# Patient Record
Sex: Male | Born: 1985 | Race: White | Hispanic: No | Marital: Single | Smoking: Current every day smoker
Health system: Southern US, Community
[De-identification: ages and names within clinical notes are randomized; demographics above are authoritative.]

## PROBLEM LIST (undated history)

## (undated) HISTORY — PX: SHOULDER SURGERY: SHX246

---
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
## 2010-06-09 ENCOUNTER — Emergency Department (HOSPITAL_COMMUNITY)
Admission: EM | Admit: 2010-06-09 | Discharge: 2010-06-09 | Disposition: A | Discharge status: Home / Self Care | Payer: Self-pay | Attending: Emergency Medicine | Admitting: Emergency Medicine

## 2010-06-09 DIAGNOSIS — M25519 Pain in unspecified shoulder: Secondary | ICD-10-CM | POA: Insufficient documentation

## 2010-06-14 ENCOUNTER — Other Ambulatory Visit (HOSPITAL_COMMUNITY): Payer: Self-pay | Admitting: Specialist

## 2010-06-14 DIAGNOSIS — M25511 Pain in right shoulder: Secondary | ICD-10-CM

## 2010-06-21 ENCOUNTER — Ambulatory Visit (HOSPITAL_COMMUNITY)
Admission: RE | Admit: 2010-06-21 | Discharge: 2010-06-21 | Disposition: A | Discharge status: Home / Self Care | Payer: Self-pay | Source: Ambulatory Visit | Attending: Specialist | Admitting: Specialist

## 2010-06-21 DIAGNOSIS — M25519 Pain in unspecified shoulder: Secondary | ICD-10-CM | POA: Insufficient documentation

## 2010-06-21 DIAGNOSIS — M249 Joint derangement, unspecified: Secondary | ICD-10-CM | POA: Insufficient documentation

## 2010-06-21 DIAGNOSIS — M25511 Pain in right shoulder: Secondary | ICD-10-CM

## 2010-06-21 MED ORDER — IOHEXOL 300 MG/ML  SOLN
10.0000 mL | Freq: Once | INTRAMUSCULAR | Status: AC | PRN
  Administered 2010-06-21: 10 mL via INTRAVENOUS

## 2010-06-21 MED ORDER — GADOBENATE DIMEGLUMINE 529 MG/ML IV SOLN: 0.0500 mL | Freq: Once | INTRAVENOUS | Status: DC | PRN

## 2010-07-07 ENCOUNTER — Encounter (HOSPITAL_COMMUNITY)
Admission: RE | Admit: 2010-07-07 | Discharge: 2010-07-07 | Disposition: A | Discharge status: Home / Self Care | Payer: Self-pay | Source: Ambulatory Visit | Attending: Orthopaedic Surgery | Admitting: Orthopaedic Surgery

## 2010-07-07 DIAGNOSIS — Z01812 Encounter for preprocedural laboratory examination: Secondary | ICD-10-CM | POA: Insufficient documentation

## 2010-07-07 DIAGNOSIS — Z01818 Encounter for other preprocedural examination: Secondary | ICD-10-CM | POA: Insufficient documentation

## 2010-07-07 LAB — SURGICAL PCR SCREEN: MRSA, PCR: NEGATIVE

## 2010-07-07 LAB — CBC
HCT: 46.3 % (ref 39.0–52.0)
MCHC: 36.5 g/dL — ABNORMAL HIGH (ref 30.0–36.0)
MCV: 90.8 fL (ref 78.0–100.0)
RDW: 12.2 % (ref 11.5–15.5)

## 2010-07-12 ENCOUNTER — Ambulatory Visit (HOSPITAL_COMMUNITY)
Admission: RE | Admit: 2010-07-12 | Discharge: 2010-07-12 | Disposition: A | Discharge status: Home / Self Care | Payer: Self-pay | Source: Ambulatory Visit | Attending: Orthopaedic Surgery | Admitting: Orthopaedic Surgery

## 2010-07-12 DIAGNOSIS — M249 Joint derangement, unspecified: Secondary | ICD-10-CM | POA: Insufficient documentation

## 2010-07-12 DIAGNOSIS — F411 Generalized anxiety disorder: Secondary | ICD-10-CM | POA: Insufficient documentation

## 2010-07-12 DIAGNOSIS — F17200 Nicotine dependence, unspecified, uncomplicated: Secondary | ICD-10-CM | POA: Insufficient documentation

## 2010-07-12 DIAGNOSIS — Z01812 Encounter for preprocedural laboratory examination: Secondary | ICD-10-CM | POA: Insufficient documentation

## 2010-07-12 DIAGNOSIS — G8929 Other chronic pain: Secondary | ICD-10-CM | POA: Insufficient documentation

## 2010-07-16 NOTE — Op Note (Signed)
NAME:  Henry Calhoun, Henry Calhoun NO.:  [id number]  MEDICAL RECORD NO.:  [id number]           PATIENT TYPE:  O  LOCATION:  [facility]                         FACILITY:  [facility]  PHYSICIAN:  Vanita Panda. Magnus Ivan, M.D.DATE OF BIRTH:  1985/03/23  DATE OF PROCEDURE:  07/12/2010 DATE OF DISCHARGE:  07/12/2010                              OPERATIVE REPORT   PREOPERATIVE DIAGNOSIS:  Extensive labral tear, right shoulder.  POSTOPERATIVE DIAGNOSIS:  Extensive labral tear, right shoulder.  PROCEDURE:  Right shoulder arthroscopy with extensive debridement of labral tear.  FINDINGS:  Degenerative labral tear mainly anterior labrum with no subluxation and inferior glenohumeral arthritis.  SURGEON:  Vanita Panda. Magnus Ivan, MD  ANESTHESIA: 1. Regional right shoulder block. 2. General.  BLOOD LOSS:  Minimal.  COMPLICATIONS:  None.  INDICATIONS:  Mr. Sproule is a 25 year old gentleman who presented to the ER with debilitating right shoulder pain; however, he states this has been going on for many years.  The ER gave him Percocet and he follows in our office and so we are here on call.  He had continued symptoms of the subluxating shoulder on my physical exam, I could not get shoulder subluxate, but he has significant pain, so we sent him for an MR arthrogram that showed a tear from the 3 o'clock to the 8 o'clock position of the labrum.  Due to the significant findings and his inability to get good pain control, he wished to undergo an arthroscopic intervention.  I explained the risks and benefits of this to him in detail and he wished to proceed with surgery.  PROCEDURE DESCRIPTION:  After informed consent was obtained, the appropriate right shoulder was marked, anesthesia obtained with regional shoulder block, he was then brought to the operating room and placed supine on the operating room table.  General anesthesia was obtained. He was then turned into a lateral position with the  right operative shoulder up padding and an axillary roll under the nonoperative shoulder and beanbag was placed as well.  His arm was then placed in an in-line skeletal retraction using the Arthrex shoulder positioning device.  His right shoulder was then prepped and draped with DuraPrep and sterile drapes.  A time-out was called to identify the correct patient and correct right shoulder.  I then made a posterior arthroscopy portal and entered the glenohumeral joint.  I went directly to the anterior aspect of the shoulder and in the rotator interval made a second portal, so I could place a probe.  Right away you could see there were degenerative changes in the labrum with tearing of the labrum, but the humeral head was actually in a normal appearing position.  There were arthritic changes at the inferior aspect and in the rim of the glenoid, but this appeared just to be a shredded type of labrum with no other instability symptoms.  Due to the anterior port, I placed arthroscopic shavers as well as up cutting biters and an arthroscopic soft tissue ablation and performed an extensive debridement.  The superior aspect of the labrum showed no tear in the biceps tendon and biceps angle was completely  intact and there was no tear at the posterior aspect of the labrum.  The undersurface of the rotator cuff was also intact.  I then allowed fluid to lavage the shoulder and removed all instrumentation.  I closed  each portal sites with interrupted nylon suture.  Xeroform followed by well- padded sterile dressing was applied, and the patient's shoulder was placed in a sling.  He was awakened, extubated, and taken to recovery room in stable condition.  Postoperatively, slowly increase his activities as comfort allows and having get the shoulder moving.  A followup appointment will be established in my office in a week.     Vanita Panda. Magnus Ivan, M.D.     CYB/MEDQ  D:  07/12/2010  T:   07/13/2010  Job:  [id number]  Electronically Signed by Doneen Poisson M.D. on 07/16/2010 11:15:25 AM

## 2010-07-16 NOTE — H&P (Signed)
  NAME:  Henry Calhoun, Henry Calhoun NO.:  [id number]  MEDICAL RECORD NO.:  [id number]           PATIENT TYPE:  O  LOCATION:  [facility]                         FACILITY:  [facility]  PHYSICIAN:  Vanita Panda. Magnus Ivan, M.D.DATE OF BIRTH:  1985/05/27  DATE OF ADMISSION:  07/12/2010 DATE OF DISCHARGE:  07/12/2010                             HISTORY & PHYSICAL   CHIEF COMPLAINT:  Right shoulder pain.  HISTORY OF PRESENT ILLNESS:  Henry Calhoun is someone who was referred to me for chronic shoulder problems.  He is 25 year old, he has had problems right down the shoulder, he says since childhood and it really flared up over the last 3 weeks after especially reaching over the head.  He was seen in the emergency room and course stay placed him on Percocet, Robaxin, ibuprofen, and he was given this in his followup visit as well. He is already asking for more narcotics.  An MRI and arthrogram was obtained in the shoulder and did show labral tear from the 3 o'clock position back to the 8 o'clock position.  On my exam, he did have instability symptoms as well, so I recommended a labral repair, I have counseled him extensively about use of narcotics as well.  PAST MEDICAL HISTORY:  No active medical problems.  DRUG ALLERGIES:  None.  PREVIOUS SURGERIES AND HOSPITALIZATIONS:  None.  FAMILY MEDICAL HISTORY:  Diabetes, blood pressure.  SOCIAL HISTORY:  He is single.  He does smoke half a pack a day, worsening construction.  He says, he does not drink alcohol.  REVIEW OF SYSTEMS:  Negative for chest pain, shortness of breath, fever, chills, nausea, vomiting.  PHYSICAL EXAMINATION:  GENERAL:  He is alert and oriented x3, in no acute distress, but obvious discomfort. HEENT:  Normocephalic and atraumatic.  Pupils are equal, round, and reactive to light.  Extraocular muscles intact. NECK:  Supple. LUNGS:  Clear to auscultation bilaterally. HEART:  Regular rate and rhythm. ABDOMEN:   Benign. EXTREMITIES:  Right shoulder shows extensive pain and clicking with range of motion with some slight instability symptoms, but a negative apprehension sign.  The MRIs reviewed and did show extensive labral tears extending from 3 o'clock to 8 o'clock involving posteroinferior and anterior portions of labrum.  Labrum appears to be shredded.  There is also tear of the inferior glenohumeral ligament.  There is degenerative changes in the humeral head and inferior aspect of the glenoid as well.  ASSESSMENT:  A right shoulder extensive labral tear.  PLAN:  We will proceed to the operating room today for arthroscopic intervention and potentially labral repair.  Depending on the findings of the LR, I have again counseled him about use of narcotics.  They will perform a block as well and thus he can go home as an outpatient following surgery.     Vanita Panda. Magnus Ivan, M.D.     CYB/MEDQ  D:  07/12/2010  T:  07/13/2010  Job:  [id number]  Electronically Signed by Doneen Poisson M.D. on 07/16/2010 11:14:38 AM

## 2011-05-05 ENCOUNTER — Emergency Department (HOSPITAL_COMMUNITY)
Admission: EM | Admit: 2011-05-05 | Discharge: 2011-05-05 | Disposition: A | Discharge status: Home / Self Care | Payer: Self-pay | Attending: Emergency Medicine | Admitting: Emergency Medicine

## 2011-05-05 ENCOUNTER — Emergency Department (HOSPITAL_COMMUNITY): Payer: Self-pay

## 2011-05-05 ENCOUNTER — Encounter (HOSPITAL_COMMUNITY): Payer: Self-pay | Admitting: Emergency Medicine

## 2011-05-05 DIAGNOSIS — J111 Influenza due to unidentified influenza virus with other respiratory manifestations: Secondary | ICD-10-CM | POA: Insufficient documentation

## 2011-05-05 DIAGNOSIS — R079 Chest pain, unspecified: Secondary | ICD-10-CM | POA: Insufficient documentation

## 2011-05-05 NOTE — Discharge Instructions (Signed)
Your strep test and xray were normal. This is likely an influenza like illness. This is caused by a virus and cannot be treated with antibiotics. Try salt water gargles and/or hot tea with honey for your sore throat. You may try saline nose spray for your congestion. Alternate Tylenol and Motrin every 4 hours as needed for fever. Return to the ED with high fever not controlled with antibiotics, worsening shortness of breath, worsening condition, or any other worrisome symptoms.  RESOURCE GUIDE  Dental Problems  Patients with Medicaid: [facility] [phone] [address]                                           [phone] Phone:  [phone]                                                  Phone:  [phone]  If unable to pay or uninsured, contact:  [facility] or [facility]. to become qualified for the adult dental clinic.  Chronic Pain Problems Contact [facility]  [phone] Patients need to be referred by their primary care doctor.  Insufficient Money for Medicine Contact [employer]:  call "211" or [facility] 445-826-3507.  No Primary Care Doctor Call [facility]  2524207292 Other agencies that provide inexpensive medical care    [facility]  [phone]    [facility]  [phone]    [facility]  [phone]    [facility]  [phone]    [facility]  [phone]    [facility]  [phone]  Psychological Services [facility]  [phone] [facility] Services  [phone] [facility]   [phone] (emergency services 4028521113)  Substance Abuse Resources [facility]  717-724-2111 [facility] [phone] [facility] [phone] Floydene Flock [phone] Residential & Outpatient Substance Abuse Program  7148330847  Abuse/Neglect [facility] Child Abuse Hotline 314-258-9930 [facility] Child Abuse Hotline [phone] (After Hours)  Emergency Shelter [facility] [phone]  Maternity Homes [facility] [phone] [facility] [phone]  MRSA Hotline #:   (973) 648-4513    [facility] Resources  [facility] [place]     [employer]                          [facility] [address] [place]                       [address]      [address]  Patrecia Pace  [facility] Phone:  [phone]                                   Phone:  [phone]                 Phone:  [place] Phone:  [place] [phone] (After Hours)

## 2011-05-05 NOTE — ED Provider Notes (Signed)
History     CSN: [id number]  Arrival date & time 05/05/11  0818   First MD Initiated Contact with Patient 05/05/11 0825      Chief Complaint  Patient presents with  . Influenza    C/O cough cold and fever, difficulty eating    (Consider location/radiation/quality/duration/timing/severity/associated sxs/prior treatment) The history is provided by the patient.   Patient presents with intermittent fevers, sore throat, congestion, cough for approximately the last 5-7 days. He states that his highest temperature at home has been 101. He has had associated chills and myalgias. He has been taking over-the-counter DayQuil for his symptoms. Denies difficulty swallowing, shortness of breath. He has had no chest pain. He states that the flu shot this year, and has had no sick contacts. Denies headache, ear pain, myalgias. Denies abdominal pain, nausea, vomiting, diarrhea.  History reviewed. No pertinent past medical history.  No past surgical history on file.  No family history on file.  History  Substance Use Topics  . Smoking status: Not on file  . Smokeless tobacco: Not on file  . Alcohol Use: No      Review of Systems ROS negative except as indicated in HPI  Allergies  Review of patient's allergies indicates no known allergies.  Home Medications   Current Outpatient Rx  Name Route Sig Dispense Refill  . DAYQUIL PO Oral Take 1 tablet by mouth every 12 (twelve) hours.      BP 134/83  Pulse 75  Temp(Src) 98.6 F (37 C) (Oral)  Resp 18  Ht 5\' 9"  (1.753 m)  Wt 210 lb (95.255 kg)  BMI 31.01 kg/m2  Physical Exam  Nursing note and vitals reviewed. Constitutional: He is oriented to person, place, and time. He appears well-developed and well-nourished. No distress.  HENT:  Head: Normocephalic and atraumatic.  Right Ear: External ear normal.  Left Ear: External ear normal.  Mouth/Throat: Oropharynx is clear and moist. No oropharyngeal exudate.  Eyes: Conjunctivae and EOM  are normal. Pupils are equal, round, and reactive to light. Right eye exhibits no discharge. Left eye exhibits no discharge.  Neck: Normal range of motion. Neck supple.  Cardiovascular: Normal rate, regular rhythm and normal heart sounds.  Exam reveals no gallop and no friction rub.   No murmur heard. Pulmonary/Chest: Effort normal and breath sounds normal. He has no wheezes. He has no rales. He exhibits no tenderness.       Bilateral breath sounds normal with good air movement. Chest is nontender to palpation.  Abdominal: Soft. Bowel sounds are normal. There is no tenderness. There is no rebound and no guarding.  Musculoskeletal: Normal range of motion.  Lymphadenopathy:    He has no cervical adenopathy.  Neurological: He is alert and oriented to person, place, and time.  Skin: Skin is warm and dry. No rash noted. He is not diaphoretic.  Psychiatric: He has a normal mood and affect.    ED Course  Procedures (including critical care time)   Labs Reviewed  RAPID STREP SCREEN   Dg Chest 2 View  05/05/2011  *RADIOLOGY REPORT*  Clinical Data: Fever and cough.  Mid chest pain.  CHEST - 2 VIEW  Comparison: Chest x-ray 06/21/2010  Findings: Lung volumes are normal.  No consolidative airspace disease.  No pleural effusions.  No pneumothorax.  No pulmonary nodule or mass noted.  Pulmonary vasculature and the cardiomediastinal silhouette are within normal limits.  IMPRESSION: 1. No radiographic evidence of acute cardiopulmonary disease.  Original Report Authenticated By:  Florencia Reasons, M.D.   I personally reviewed the plain films.  1. Influenza-like illness       MDM  Pt with fevers for past several days. He is afebrile here. CXR unremarkable. Suspect likely influenza like illness. Will tx symptomatically. Return precautions discussed.       Treon Kehl, [place] 05/05/11 [phone]

## 2011-05-05 NOTE — ED Notes (Signed)
Patient had his chest X Ray and rapid strep was done and sent to the lab

## 2011-05-15 NOTE — ED Provider Notes (Signed)
Medical screening examination/treatment/procedure(s) were performed by non-physician practitioner and as supervising physician I was immediately available for consultation/collaboration.  Howell Groesbeck P Tzirel Leonor, MD 05/15/11 1502 

## 2012-06-21 ENCOUNTER — Emergency Department (HOSPITAL_COMMUNITY)
Admission: EM | Admit: 2012-06-21 | Discharge: 2012-06-21 | Disposition: A | Discharge status: Home / Self Care | Payer: Self-pay | Attending: Emergency Medicine | Admitting: Emergency Medicine

## 2012-06-21 ENCOUNTER — Encounter (HOSPITAL_COMMUNITY): Payer: Self-pay | Admitting: Emergency Medicine

## 2012-06-21 DIAGNOSIS — K029 Dental caries, unspecified: Secondary | ICD-10-CM | POA: Insufficient documentation

## 2012-06-21 DIAGNOSIS — S025XXA Fracture of tooth (traumatic), initial encounter for closed fracture: Secondary | ICD-10-CM | POA: Insufficient documentation

## 2012-06-21 DIAGNOSIS — F17200 Nicotine dependence, unspecified, uncomplicated: Secondary | ICD-10-CM | POA: Insufficient documentation

## 2012-06-21 DIAGNOSIS — Y929 Unspecified place or not applicable: Secondary | ICD-10-CM | POA: Insufficient documentation

## 2012-06-21 DIAGNOSIS — X58XXXA Exposure to other specified factors, initial encounter: Secondary | ICD-10-CM | POA: Insufficient documentation

## 2012-06-21 DIAGNOSIS — Y9389 Activity, other specified: Secondary | ICD-10-CM | POA: Insufficient documentation

## 2012-06-21 MED ORDER — OXYCODONE-ACETAMINOPHEN 5-325 MG PO TABS: 1.0000 | ORAL_TABLET | Freq: Four times a day (QID) | ORAL | Status: DC | PRN

## 2012-06-21 MED ORDER — AMOXICILLIN 500 MG PO CAPS: 500.0000 mg | ORAL_CAPSULE | Freq: Three times a day (TID) | ORAL | Status: DC

## 2012-06-21 NOTE — ED Provider Notes (Signed)
History    This chart was scribed for non-physician practitioner working with Hanley Seamen, MD by Frederik Pear, ED Scribe. This patient was seen in room WTR1/WLPT1 and the patient's care was started at 2257.   CSN: [id number]  Arrival date & time 06/21/12  2252   First MD Initiated Contact with Patient 06/21/12 2257      Chief Complaint  Patient presents with  . Dental Pain    (Consider location/radiation/quality/duration/timing/severity/associated sxs/prior treatment) The history is provided by the patient and medical records. No language interpreter was used.    Henry Calhoun is a 27 y.o. male who presents to the Emergency Department complaining of sudden onset, constant, 10/10 right upper dental pain that is aggravated by eating and drinking and alleviated by nothing that began around 1200 today when his tooth broke. He denies any associate fevers. He states that he treated the pain with Tylenol and Orajel with no relief.  History reviewed. No pertinent past medical history.  Past Surgical History  Procedure Laterality Date  . Shoulder surgery      History reviewed. No pertinent family history.  History  Substance Use Topics  . Smoking status: Current Every Day Smoker -- 0.50 packs/day  . Smokeless tobacco: Not on file  . Alcohol Use: No      Review of Systems  HENT: Positive for dental problem.   All other systems reviewed and are negative.   Allergies  Review of patient's allergies indicates no known allergies.  Home Medications   Current Outpatient Rx  Name  Route  Sig  Dispense  Refill  . amoxicillin (AMOXIL) 500 MG capsule   Oral   Take 1 capsule (500 mg total) by mouth 3 (three) times daily.   21 capsule   0   . oxyCODONE-acetaminophen (PERCOCET/ROXICET) 5-325 MG per tablet   Oral   Take 1 tablet by mouth every 6 (six) hours as needed for pain.   12 tablet   0   . Pseudoephedrine-APAP-DM (DAYQUIL PO)   Oral   Take 1 tablet by mouth every 12  (twelve) hours.           BP 136/74  Pulse 87  Temp(Src) 98.7 F (37.1 C) (Oral)  Resp 15  Ht 5\' 10"  (1.778 m)  Wt 210 lb (95.255 kg)  BMI 30.13 kg/m2  SpO2 98%  Physical Exam  Nursing note and vitals reviewed. Constitutional: He is oriented to person, place, and time. He appears well-developed and well-nourished. No distress.  HENT:  Head: Normocephalic and atraumatic.  Mouth/Throat: Dental caries present.    Eyes: Conjunctivae and EOM are normal. Pupils are equal, round, and reactive to light.  Neck: Normal range of motion. Neck supple. No tracheal deviation present.  Cardiovascular: Normal rate and regular rhythm.   Pulmonary/Chest: Effort normal and breath sounds normal. No respiratory distress.  Abdominal: Soft. He exhibits no distension.  Musculoskeletal: Normal range of motion. He exhibits no edema.  Neurological: He is alert and oriented to person, place, and time.  Skin: Skin is warm and dry.  Psychiatric: He has a normal mood and affect. His behavior is normal.    ED Course  Procedures (including critical care time)  DIAGNOSTIC STUDIES: Oxygen Saturation is 98% on room air, normal  by my interpretation.    COORDINATION OF CARE:  23:02- Discussed planned course of treatment with the patient, including pain control medication, a course of antitbiotics, and a dental block, who is agreeable at this time.  Labs  Reviewed - No data to display No results found.   1. Broken tooth without complication, closed, initial encounter       MDM  Dental nlock performed. Dental referral given.  Pt has been advised of the symptoms that warrant their return to the ED. Patient has voiced understanding and has agreed to follow-up with the PCP or specialist.  I personally performed the services described in this documentation, which was scribed in my presence. The recorded information has been reviewed and is accurate.      Dorthula Matas, PA-C 06/21/12 2339

## 2012-06-21 NOTE — ED Notes (Signed)
Pt states he broke a tooth today at lunch time  Pt states the tooth is on the top right

## 2012-06-22 NOTE — ED Provider Notes (Signed)
Medical screening examination/treatment/procedure(s) were performed by non-physician practitioner and as supervising physician I was immediately available for consultation/collaboration.   Hanley Seamen, MD 06/22/12 [phone]

## 2012-06-24 ENCOUNTER — Emergency Department (HOSPITAL_COMMUNITY)
Admission: EM | Admit: 2012-06-24 | Discharge: 2012-06-24 | Disposition: A | Discharge status: Home / Self Care | Payer: Self-pay | Attending: Emergency Medicine | Admitting: Emergency Medicine

## 2012-06-24 ENCOUNTER — Encounter (HOSPITAL_COMMUNITY): Payer: Self-pay | Admitting: *Deleted

## 2012-06-24 DIAGNOSIS — R197 Diarrhea, unspecified: Secondary | ICD-10-CM | POA: Insufficient documentation

## 2012-06-24 DIAGNOSIS — R220 Localized swelling, mass and lump, head: Secondary | ICD-10-CM | POA: Insufficient documentation

## 2012-06-24 DIAGNOSIS — K089 Disorder of teeth and supporting structures, unspecified: Secondary | ICD-10-CM | POA: Insufficient documentation

## 2012-06-24 DIAGNOSIS — R509 Fever, unspecified: Secondary | ICD-10-CM | POA: Insufficient documentation

## 2012-06-24 DIAGNOSIS — R112 Nausea with vomiting, unspecified: Secondary | ICD-10-CM | POA: Insufficient documentation

## 2012-06-24 DIAGNOSIS — F17200 Nicotine dependence, unspecified, uncomplicated: Secondary | ICD-10-CM | POA: Insufficient documentation

## 2012-06-24 DIAGNOSIS — K0889 Other specified disorders of teeth and supporting structures: Secondary | ICD-10-CM

## 2012-06-24 MED ORDER — IBUPROFEN 600 MG PO TABS: 600.0000 mg | ORAL_TABLET | Freq: Four times a day (QID) | ORAL | Status: DC | PRN

## 2012-06-24 MED ORDER — OXYCODONE-ACETAMINOPHEN 5-325 MG PO TABS: 1.0000 | ORAL_TABLET | ORAL | Status: DC | PRN

## 2012-06-24 MED ORDER — CLINDAMYCIN HCL 150 MG PO CAPS: 300.0000 mg | ORAL_CAPSULE | Freq: Three times a day (TID) | ORAL | Status: DC

## 2012-06-24 NOTE — ED Provider Notes (Signed)
History    This chart was scribed for non-physician practitioner working with Henry Calhoun. Ghim, MD by ED Scribe, Burman Nieves. This patient was seen in room WTR5/WTR5 and the patient's care was started at 5:24 PM.   CSN: [id number]  Arrival date & time 06/24/12  1444   First MD Initiated Contact with Patient 06/24/12 1724      Chief Complaint  Patient presents with  . Dental Pain  . Diarrhea  . Emesis    (Consider location/radiation/quality/duration/timing/severity/associated sxs/prior treatment) Patient is a 27 y.o. male presenting with tooth pain. The history is provided by the patient. No language interpreter was used.  Dental PainThe primary symptoms include mouth pain. The symptoms are worsening. The symptoms occur constantly.  Mouth pain occurs constantly.   Henry Calhoun is a 27 y.o. male who presents to the Emergency Department complaining of moderate constant right sided dental pain with associated subjective fever onset 3 days ago. Pt states that she has had nausea, vomiting, and diarrhea since 10:30 am this morning. Last episode of emesis was this morning. Pt states he is taking oxycodone for the pain as of now. Pt states that he has been taking antibiotics (Amoxycilin) since last Thursday with no immediate relief. Pt denies chills, cough, SOB, weakness, and any other associated symptoms.   History reviewed. No pertinent past medical history.  Past Surgical History  Procedure Laterality Date  . Shoulder surgery      No family history on file.  History  Substance Use Topics  . Smoking status: Current Every Day Smoker -- 0.50 packs/day  . Smokeless tobacco: Not on file  . Alcohol Use: No      Review of Systems  HENT: Positive for dental problem.   All other systems reviewed and are negative.    Allergies  Review of patient's allergies indicates no known allergies.  Home Medications   Current Outpatient Rx  Name  Route  Sig  Dispense  Refill  . acetaminophen  (TYLENOL) 500 MG tablet   Oral   Take 1,000 mg by mouth every 6 (six) hours as needed for pain.         Marland Kitchen amoxicillin (AMOXIL) 500 MG capsule   Oral   Take 1 capsule (500 mg total) by mouth 3 (three) times daily.   21 capsule   0   . benzocaine (ORAJEL) 10 % mucosal gel   Mouth/Throat   Use as directed in the mouth or throat as needed for pain.         Marland Kitchen oxyCODONE-acetaminophen (PERCOCET/ROXICET) 5-325 MG per tablet   Oral   Take 1 tablet by mouth every 6 (six) hours as needed for pain.   12 tablet   0     BP 119/76  Pulse 90  Temp(Src) 98.1 F (36.7 C) (Oral)  Resp 18  SpO2 97%  Physical Exam  Nursing note and vitals reviewed. Constitutional: He is oriented to person, place, and time. He appears well-developed and well-nourished. No distress.  HENT:  Head: Normocephalic and atraumatic.  Right upper 3rd molar look partially chipped with cavity accompanied with swelling on surrounding gum.   Eyes: EOM are normal. Pupils are equal, round, and reactive to light.  Neck: Neck supple. No tracheal deviation present.  Cardiovascular: Normal rate, regular rhythm and normal heart sounds.   Pulmonary/Chest: Effort normal and breath sounds normal. No respiratory distress. He has no wheezes.  Musculoskeletal: Normal range of motion.  Neurological: He is alert and oriented to person,  place, and time.  Skin: Skin is warm and dry.  Psychiatric: He has a normal mood and affect. His behavior is normal.    ED Course  Procedures (including critical care time) DIAGNOSTIC STUDIES: Oxygen Saturation is 97% on room air, adequate by my interpretation.    COORDINATION OF CARE: 6:17 PM Discussed ED treatment with pt and pt agrees. ,    Labs Reviewed - No data to display No results found.   1. Pain, dental       MDM  Pt with dental pain with possible early abscess. Will switch to clinamycin, no improvement on amoxil. Will also refer to oral surgery.  Pt otherwise non toxic  afebrile     Filed Vitals:   06/24/12 1702  BP: 119/76  Pulse: 90  Temp: 98.1 F (36.7 C)  TempSrc: Oral  Resp: 18  SpO2: 97%          Lottie Mussel, PA-C 06/25/12 0143  Lottie Mussel, PA-C 06/25/12 0143

## 2012-06-24 NOTE — ED Notes (Signed)
Pt states was here Friday for dental pain on R side, given dental block, still having dental pain, also this morning had nausea, vomiting and diarrhea, states hasn't had vomiting or diarrhea since 10:30 am this morning.

## 2012-06-25 NOTE — ED Provider Notes (Signed)
Medical screening examination/treatment/procedure(s) were performed by non-physician practitioner and as supervising physician I was immediately available for consultation/collaboration.  [name]. Oletta Lamas, MD 06/25/12 9562

## 2012-07-15 ENCOUNTER — Encounter (HOSPITAL_COMMUNITY): Payer: Self-pay | Admitting: Emergency Medicine

## 2012-07-15 ENCOUNTER — Emergency Department (HOSPITAL_COMMUNITY)
Admission: EM | Admit: 2012-07-15 | Discharge: 2012-07-15 | Disposition: A | Discharge status: Home / Self Care | Payer: Self-pay | Attending: Emergency Medicine | Admitting: Emergency Medicine

## 2012-07-15 DIAGNOSIS — F17200 Nicotine dependence, unspecified, uncomplicated: Secondary | ICD-10-CM | POA: Insufficient documentation

## 2012-07-15 DIAGNOSIS — K029 Dental caries, unspecified: Secondary | ICD-10-CM | POA: Insufficient documentation

## 2012-07-15 DIAGNOSIS — K0381 Cracked tooth: Secondary | ICD-10-CM | POA: Insufficient documentation

## 2012-07-15 MED ORDER — IBUPROFEN 800 MG PO TABS: 800.0000 mg | ORAL_TABLET | Freq: Three times a day (TID) | ORAL | Status: DC

## 2012-07-15 MED ORDER — PENICILLIN V POTASSIUM 500 MG PO TABS: 500.0000 mg | ORAL_TABLET | Freq: Four times a day (QID) | ORAL | Status: AC

## 2012-07-15 MED ORDER — HYDROCODONE-ACETAMINOPHEN 5-325 MG PO TABS: 1.0000 | ORAL_TABLET | Freq: Four times a day (QID) | ORAL | Status: DC | PRN

## 2012-07-15 NOTE — ED Notes (Signed)
Pt states that he broke one of his molars this morning and it is split in two. Feels infected.

## 2012-07-15 NOTE — ED Provider Notes (Signed)
History    This chart was scribed for non-physician practitioner Jaynie Crumble, PA-C working with Raeford Razor, MD by Gerlean Ren, ED Scribe. This patient was seen in room WTR6/WTR6 and the patient's care was started at 9:49 PM.    CSN: [id number]  Arrival date & time 07/15/12  2116   First MD Initiated Contact with Patient 07/15/12 2131      Chief Complaint  Patient presents with  . Dental Pain    The history is provided by the patient. No language interpreter was used.  Henry Calhoun is a 27 y.o. male who presents to the Emergency Department complaining of lower right dental pain with sudden onset this morning while eating.  Pt states the tooth cracked and that it feels infected.  Pt has used Tylenol with no improvements to pain.   Pt has dentist in [place] that he has not seen today.    History reviewed. No pertinent past medical history.  Past Surgical History  Procedure Laterality Date  . Shoulder surgery      History reviewed. No pertinent family history.  History  Substance Use Topics  . Smoking status: Current Every Day Smoker -- 0.50 packs/day  . Smokeless tobacco: Not on file  . Alcohol Use: No      Review of Systems  Constitutional: Negative for fever and chills.  HENT: Positive for dental problem.   Respiratory: Negative for shortness of breath.   Gastrointestinal: Negative for nausea and vomiting.  Neurological: Negative for weakness.    Allergies  Review of patient's allergies indicates no known allergies.  Home Medications   Current Outpatient Rx  Name  Route  Sig  Dispense  Refill  . acetaminophen (TYLENOL) 500 MG tablet   Oral   Take 1,000 mg by mouth every 6 (six) hours as needed for pain.           BP 146/87  Pulse 69  Temp(Src) 98.9 F (37.2 C) (Oral)  SpO2 99%  Physical Exam  Nursing note and vitals reviewed. Constitutional: He is oriented to person, place, and time. He appears well-developed and well-nourished. No distress.   HENT:  Head: Normocephalic and atraumatic.  Multiple carries and filling throughout, right lower second molar has filling but no surrounding erythema or swelling.  Eyes: EOM are normal.  Neck: Neck supple. No tracheal deviation present.  Cardiovascular: Normal rate.   Pulmonary/Chest: Effort normal. No respiratory distress.  Musculoskeletal: Normal range of motion.  Neurological: He is alert and oriented to person, place, and time.  Skin: Skin is warm and dry.  Psychiatric: He has a normal mood and affect. His behavior is normal.    ED Course  Procedures (including critical care time) DIAGNOSTIC STUDIES: Oxygen Saturation is 99% on room air, normal by my interpretation.    COORDINATION OF CARE: 9:52 PM- Informed pt that he needs to see his dentist.  Educated pt of local dental care available at reduced cost.  Discussed antibiotics and short term pain medicine.  Pt verbalizes understanding and agrees with plan.     1. Pain due to dental caries       MDM  Pt with dental pain. Multiple carries on exam. No signs of a dental abscess. Will start on penicillin, pain medications, follow up with dentist.   I personally performed the services described in this documentation, which was scribed in my presence. The recorded information has been reviewed and is accurate.        Lottie Mussel, PA-C  07/16/12 0031 

## 2012-07-16 ENCOUNTER — Telehealth (HOSPITAL_COMMUNITY): Payer: Self-pay | Admitting: Emergency Medicine

## 2012-07-16 NOTE — ED Notes (Signed)
Pt called Dr Larina Earthly and was told that they wouldn't see him and they weren't on call. Checked chart and pt was referred to Dr Gerri Lins. Called the dentist and faxed over the referral to [phone] to [place].

## 2012-07-17 NOTE — ED Provider Notes (Signed)
Medical screening examination/treatment/procedure(s) were performed by non-physician practitioner and as supervising physician I was immediately available for consultation/collaboration.  Amir Fick, MD 07/17/12 0056 

## 2013-03-08 ENCOUNTER — Emergency Department (HOSPITAL_COMMUNITY): Payer: Self-pay

## 2013-03-08 ENCOUNTER — Encounter (HOSPITAL_COMMUNITY): Payer: Self-pay | Admitting: Emergency Medicine

## 2013-03-08 ENCOUNTER — Emergency Department (HOSPITAL_COMMUNITY)
Admission: EM | Admit: 2013-03-08 | Discharge: 2013-03-08 | Disposition: A | Discharge status: Home / Self Care | Payer: Self-pay | Attending: Emergency Medicine | Admitting: Emergency Medicine

## 2013-03-08 DIAGNOSIS — F17200 Nicotine dependence, unspecified, uncomplicated: Secondary | ICD-10-CM | POA: Insufficient documentation

## 2013-03-08 DIAGNOSIS — Z9889 Other specified postprocedural states: Secondary | ICD-10-CM | POA: Insufficient documentation

## 2013-03-08 DIAGNOSIS — M25511 Pain in right shoulder: Secondary | ICD-10-CM

## 2013-03-08 DIAGNOSIS — M25519 Pain in unspecified shoulder: Secondary | ICD-10-CM | POA: Insufficient documentation

## 2013-03-08 MED ORDER — HYDROCODONE-ACETAMINOPHEN 5-325 MG PO TABS: 1.0000 | ORAL_TABLET | ORAL | Status: DC | PRN

## 2013-03-08 MED ORDER — IBUPROFEN 800 MG PO TABS: 800.0000 mg | ORAL_TABLET | Freq: Three times a day (TID) | ORAL | Status: DC

## 2013-03-08 NOTE — ED Notes (Signed)
Pt with hx of shoulder surgery.  Pt works in [employer].  Pt denies any overt injury.  Pt states he's had increasing pain x 1 week.

## 2013-03-08 NOTE — ED Notes (Signed)
X-ray dept called to give room number for PT.

## 2013-03-08 NOTE — ED Notes (Addendum)
Pt reports right shoulder pain x 1 week, hx of shoulder surgery 3 years ago, denies new injury. Pt reports its a burning pain that increases with movement.

## 2013-03-08 NOTE — ED Provider Notes (Signed)
CSN: [id number]     Arrival date & time 03/08/13  1603 History   First MD Initiated Contact with Patient 03/08/13 1821 This chart was scribed for non-physician practitioner Allean Found, PA-C working with Hurman Horn, MD by Valera Castle, ED scribe. This patient was seen in room TR05C/TR05C and the patient's care was started at 7:03 PM.     Chief Complaint  Patient presents with  . Shoulder Pain    The history is provided by the patient. No language interpreter was used.   HPI Comments: Henry Calhoun is a 27 y.o. male who presents to the Emergency Department complaining of sudden, burning, intermittent, right shoulder pain, onset a few weeks ago. He reports h/o shoulder surgery 3 years ago for torn labrum. He states that his burning pain is exacerbated by movement, especially turning his neck. He reports not being able to use hammers and other tools normally due to the pain. He reports taking Ibuprofen for his pain, with little relief. He denies fever, cough, and any other associated symptoms. He denies any medical history.   PCP - No PCP Per Patient  History reviewed. No pertinent past medical history. Past Surgical History  Procedure Laterality Date  . Shoulder surgery     History reviewed. No pertinent family history. History  Substance Use Topics  . Smoking status: Current Every Day Smoker -- 0.50 packs/day  . Smokeless tobacco: Not on file  . Alcohol Use: No    Review of Systems  Constitutional: Negative for fever.  Respiratory: Negative for cough.   Musculoskeletal: Positive for arthralgias (right shoulder).  All other systems reviewed and are negative.    Allergies  Review of patient's allergies indicates no known allergies.  Home Medications  No current outpatient prescriptions on file.  BP 121/70  Pulse 87  Temp(Src) 98 F (36.7 C) (Oral)  Resp 18  Wt 198 lb 6 oz (89.982 kg)  SpO2 97%  Physical Exam  Nursing note and vitals reviewed. Constitutional: He  is oriented to person, place, and time. He appears well-developed and well-nourished. No distress.  HENT:  Head: Normocephalic and atraumatic.  Eyes: EOM are normal.  Neck: Neck supple. No tracheal deviation present.  Cardiovascular: Normal rate.   DP intact.   Pulmonary/Chest: Effort normal. No respiratory distress.  Musculoskeletal: Normal range of motion.  No midline or pericervical tenderness. Right shoulder without swelling discoloration. No deformity. Full ROM with pain on extremes. Painful abduction.   Neurological: He is alert and oriented to person, place, and time.  Skin: Skin is warm and dry.  Psychiatric: He has a normal mood and affect. His behavior is normal.    ED Course  Procedures (including critical care time)  DIAGNOSTIC STUDIES: Oxygen Saturation is 97% on room air, normal by my interpretation.    COORDINATION OF CARE: 7:06 PM-Discussed treatment plan which includes imaging results with pt at bedside and pt agreed to plan.   Labs Review Labs Reviewed - No data to display Imaging Review No results found.  EKG Interpretation   None      No orders of the defined types were placed in this encounter.    MDM  No diagnosis found. 1. Right shoulder pain  Uncomplicated right shoulder pain without concerning exam deficits.       I personally performed the services described in this documentation, which was scribed in my presence. The recorded information has been reviewed and is accurate.     Arnoldo Hooker, PA-C 03/08/13  1923 

## 2013-03-10 NOTE — ED Provider Notes (Signed)
Medical screening examination/treatment/procedure(s) were performed by non-physician practitioner and as supervising physician I was immediately available for consultation/collaboration.  Dhalia Zingaro M Jourdyn Hasler, MD 03/10/13 1627 

## 2013-07-03 ENCOUNTER — Encounter (HOSPITAL_COMMUNITY): Payer: Self-pay | Admitting: Emergency Medicine

## 2013-07-03 ENCOUNTER — Emergency Department (HOSPITAL_COMMUNITY)
Admission: EM | Admit: 2013-07-03 | Discharge: 2013-07-03 | Disposition: A | Discharge status: Home / Self Care | Payer: Self-pay | Attending: Emergency Medicine | Admitting: Emergency Medicine

## 2013-07-03 ENCOUNTER — Emergency Department (HOSPITAL_COMMUNITY): Payer: Self-pay

## 2013-07-03 DIAGNOSIS — F458 Other somatoform disorders: Secondary | ICD-10-CM

## 2013-07-03 DIAGNOSIS — R590 Localized enlarged lymph nodes: Secondary | ICD-10-CM

## 2013-07-03 DIAGNOSIS — F17200 Nicotine dependence, unspecified, uncomplicated: Secondary | ICD-10-CM | POA: Insufficient documentation

## 2013-07-03 DIAGNOSIS — F411 Generalized anxiety disorder: Secondary | ICD-10-CM | POA: Insufficient documentation

## 2013-07-03 DIAGNOSIS — F449 Dissociative and conversion disorder, unspecified: Secondary | ICD-10-CM | POA: Insufficient documentation

## 2013-07-03 DIAGNOSIS — R599 Enlarged lymph nodes, unspecified: Secondary | ICD-10-CM | POA: Insufficient documentation

## 2013-07-03 LAB — CBC WITH DIFFERENTIAL/PLATELET
BASOS ABS: 0 10*3/uL (ref 0.0–0.1)
Basophils Relative: 0 % (ref 0–1)
Eosinophils Absolute: 0.2 10*3/uL (ref 0.0–0.7)
Eosinophils Relative: 1 % (ref 0–5)
HCT: 47.8 % (ref 39.0–52.0)
Hemoglobin: 17.9 g/dL — ABNORMAL HIGH (ref 13.0–17.0)
LYMPHS ABS: 2.6 10*3/uL (ref 0.7–4.0)
LYMPHS PCT: 20 % (ref 12–46)
MCH: 32.8 pg (ref 26.0–34.0)
MCHC: 37.4 g/dL — ABNORMAL HIGH (ref 30.0–36.0)
MCV: 87.5 fL (ref 78.0–100.0)
Monocytes Absolute: 1.3 10*3/uL — ABNORMAL HIGH (ref 0.1–1.0)
Monocytes Relative: 10 % (ref 3–12)
NEUTROS ABS: 8.9 10*3/uL — AB (ref 1.7–7.7)
NEUTROS PCT: 68 % (ref 43–77)
PLATELETS: 198 10*3/uL (ref 150–400)
RBC: 5.46 MIL/uL (ref 4.22–5.81)
RDW: 11.9 % (ref 11.5–15.5)
WBC: 13 10*3/uL — AB (ref 4.0–10.5)

## 2013-07-03 LAB — RAPID STREP SCREEN (MED CTR MEBANE ONLY): STREPTOCOCCUS, GROUP A SCREEN (DIRECT): NEGATIVE

## 2013-07-03 LAB — BASIC METABOLIC PANEL
BUN: 13 mg/dL (ref 6–23)
CHLORIDE: 98 meq/L (ref 96–112)
CO2: 21 meq/L (ref 19–32)
CREATININE: 0.98 mg/dL (ref 0.50–1.35)
Calcium: 9.2 mg/dL (ref 8.4–10.5)
GFR calc non Af Amer: >90 mL/min (ref >90)
Glucose, Bld: 109 mg/dL — ABNORMAL HIGH (ref 70–99)
POTASSIUM: 3.7 meq/L (ref 3.7–5.3)
SODIUM: 138 meq/L (ref 137–147)

## 2013-07-03 LAB — MONONUCLEOSIS SCREEN: MONO SCREEN: NEGATIVE

## 2013-07-03 LAB — ETHANOL: ALCOHOL ETHYL (B): 68 mg/dL — AB (ref 0–11)

## 2013-07-03 MED ORDER — CEPASTAT 14.5 MG MT LOZG: 1.0000 | LOZENGE | OROMUCOSAL | Status: DC | PRN

## 2013-07-03 MED ORDER — KETOROLAC TROMETHAMINE 30 MG/ML IJ SOLN
60.0000 mg | Freq: Once | INTRAMUSCULAR | Status: AC
  Administered 2013-07-03: 60 mg via INTRAMUSCULAR
  Filled 2013-07-03: qty 2

## 2013-07-03 MED ORDER — METHYLPREDNISOLONE SODIUM SUCC 125 MG IJ SOLR
125.0000 mg | Freq: Once | INTRAMUSCULAR | Status: AC
Start: 2013-07-03 — End: 2013-07-03
  Administered 2013-07-03: 125 mg via INTRAVENOUS
  Filled 2013-07-03: qty 2

## 2013-07-03 MED ORDER — LORAZEPAM 2 MG/ML IJ SOLN
1.0000 mg | Freq: Once | INTRAMUSCULAR | Status: AC
  Administered 2013-07-03: 1 mg via INTRAVENOUS
  Filled 2013-07-03: qty 1

## 2013-07-03 MED ORDER — IOHEXOL 300 MG/ML  SOLN
100.0000 mL | Freq: Once | INTRAMUSCULAR | Status: AC | PRN
  Administered 2013-07-03: 100 mL via INTRAVENOUS

## 2013-07-03 MED ORDER — NAPROXEN 500 MG PO TABS: 500.0000 mg | ORAL_TABLET | Freq: Two times a day (BID) | ORAL | Status: DC

## 2013-07-03 MED ORDER — METHYLPREDNISOLONE 4 MG PO KIT: PACK | ORAL | Status: DC

## 2013-07-03 NOTE — ED Provider Notes (Signed)
CSN: [id number]     Arrival date & time 07/03/13  0219 History   First MD Initiated Contact with Patient 07/03/13 0226     Chief Complaint  Patient presents with  . Allergic Reaction     (Consider location/radiation/quality/duration/timing/severity/associated sxs/prior Treatment) HPI 28 yo male presents to the ER from home with complaint of the sensation of swelling of his tongue and throat.  Patient reports over the last 2-3 days, he has had swelling of neck glands and a painful knot with some sore throat.  He reports when he takes a hot shower, symptoms improved.  He has had some sinus problems.  Tonight around midnight, patient had some [place] food.  Patient felt like his glands were swelling after eating.  He felt like he had difficulties swallowing and breathing.  Patient reports as long as he was drinking water continuously, he felt better.   No hives, no wheezing, no shortness of breath.  He has eaten [place] food before without difficulty.  Patient reports that he felt like he was panicking because his throat was closing up.  Patient appeared very anxious upon arrival History reviewed. No pertinent past medical history. Past Surgical History  Procedure Laterality Date  . Shoulder surgery     History reviewed. No pertinent family history. History  Substance Use Topics  . Smoking status: Current Every Day Smoker -- 0.50 packs/day  . Smokeless tobacco: Not on file  . Alcohol Use: No    Review of Systems   See History of Present Illness; otherwise all other systems are reviewed and negative  Allergies  Review of patient's allergies indicates no known allergies.  Home Medications   Prior to Admission medications   Not on File   BP 163/80  Pulse 96  Temp(Src) 98.8 F (37.1 C) (Oral)  Resp 19  Ht 5\' 10"  (1.778 m)  Wt 205 lb (92.987 kg)  BMI 29.41 kg/m2  SpO2 98% Physical Exam  Nursing note and vitals reviewed. Constitutional: He is oriented to person, place, and  time. He appears well-developed and well-nourished. He appears distressed.  HENT:  Head: Normocephalic and atraumatic.  Right Ear: External ear normal.  Left Ear: External ear normal.  Nose: Nose normal.  Mouth/Throat: Oropharynx is clear and moist. No oropharyngeal exudate.  Patient has some painful lymphadenopathy to the preauricular nodes bilaterally  Eyes: Conjunctivae and EOM are normal. Pupils are equal, round, and reactive to light.  Neck: Normal range of motion. Neck supple. No JVD present. No tracheal deviation present. No thyromegaly present.  Cardiovascular: Normal rate, regular rhythm, normal heart sounds and intact distal pulses.  Exam reveals no gallop and no friction rub.   No murmur heard. Pulmonary/Chest: Effort normal and breath sounds normal. No stridor. No respiratory distress. He has no wheezes. He has no rales. He exhibits no tenderness.  Abdominal: Soft. Bowel sounds are normal. He exhibits no distension and no mass. There is no tenderness. There is no rebound and no guarding.  Musculoskeletal: Normal range of motion. He exhibits no edema and no tenderness.  Lymphadenopathy:    He has cervical adenopathy (patient has diffuse lymphadenopathy in the anterior cervical chain.  He has an enlarged lymph nodes, just anterior to his left angle of mandible..  There is no crepitus, no fluctuance).  Neurological: He is alert and oriented to person, place, and time. He has normal reflexes. No cranial nerve deficit. He exhibits normal muscle tone. Coordination normal.  Skin: Skin is warm and dry. No rash  noted. No erythema. No pallor.  Psychiatric: His behavior is normal. Judgment and thought content normal.  Anxious appearing    ED Course  Procedures (including critical care time) Labs Review Labs Reviewed  CBC WITH DIFFERENTIAL - Abnormal; Notable for the following:    WBC 13.0 (*)    Hemoglobin 17.9 (*)    MCHC 37.4 (*)    Neutro Abs 8.9 (*)    Monocytes Absolute 1.3 (*)     All other components within normal limits  BASIC METABOLIC PANEL - Abnormal; Notable for the following:    Glucose, Bld 109 (*)    All other components within normal limits  ETHANOL - Abnormal; Notable for the following:    Alcohol, Ethyl (B) 68 (*)    All other components within normal limits  RAPID STREP SCREEN  CULTURE, GROUP A STREP  MONONUCLEOSIS SCREEN    Imaging Review Ct Soft Tissue Neck W Contrast  07/03/2013   CLINICAL DATA:  Swelling to the left side of the neck after eating try knees food at midnight. Difficulty swallowing. Twitching movement.  EXAM: CT NECK WITH CONTRAST  TECHNIQUE: Multidetector CT imaging of the neck was performed using the standard protocol following the bolus administration of intravenous contrast.  CONTRAST:  100mL OMNIPAQUE IOHEXOL 300 MG/ML  SOLN  COMPARISON:  CT HEAD W/O CM dated 09/23/2010  FINDINGS: Examination is technically limited due to severe motion artifact and streak artifact from dental hardware, rendering significant portions of the examination uninterpretable. Visualized segments demonstrate no gross mass or fluid collection in the neck. Visualized cervical carotid and jugular vessels appear patent. Visualized salivary glands appear symmetrical. Nasal oral pharyngeal airway appears patent. Visualized lymph nodes are not pathologically enlarged.  IMPRESSION: Examination is significantly technically limited. As visualize, no gross evidence of mass or fluid collection.   Electronically Signed   By: Burman NievesWilliam  Stevens M.D.   On: 07/03/2013 06:01     EKG Interpretation None      MDM   Final diagnoses:  Cervical lymphadenopathy  Globus hystericus    28 year old male with a sensation that he could not swallow or breathe.  After eating [place] food.  No signs of edema in his oropharynx.  No angioedema appreciated anywhere.  Patient does have lymphadenopathy.  CT scan without significant findings although overall a poor study.  Patient has fallen  asleep after medications, no stridor noted.  Patient is handling his secretions well, no signs of respiratory distress.  Patient reassured, plan to send home on steroids, Naprosyn, and Cepacol lozenges.  Patient instructed to salt water gargles.    Olivia Mackielga M Donise Woodle, MD 07/03/13 [id number]

## 2013-07-03 NOTE — Discharge Instructions (Signed)
Cervical Adenitis You have a swollen lymph gland in your neck. This commonly happens with Strep and virus infections, dental problems, insect bites, and injuries about the face, scalp, or neck. The lymph glands swell as the body fights the infection or heals the injury. Swelling and firmness typically lasts for several weeks after the infection or injury is healed. Rarely lymph glands can become swollen because of cancer or TB. Antibiotics are prescribed if there is evidence of an infection. Sometimes an infected lymph gland becomes filled with pus. This condition may require opening up the abscessed gland by draining it surgically. Most of the time infected glands return to normal within two weeks. Do not poke or squeeze the swollen lymph nodes. That may keep them from shrinking back to their normal size. If the lymph gland is still swollen after 2 weeks, further medical evaluation is needed.  SEEK IMMEDIATE MEDICAL CARE IF:  You have difficulty swallowing or breathing, increased swelling, severe pain, or a high fever.  Document Released: 02/27/2005 Document Revised: 05/22/2011 Document Reviewed: 08/19/2006 [facility] Patient Information 2014 [place], [place].   Globus Syndrome Globus Syndrome is a feeling of a lump or a sensation of something caught in your throat. Eating food or drinking fluids does not seem to get rid of it. Yet it is not noticeable during the actual act of swallowing food or liquids. Usually there is nothing physically wrong. It is troublesome because it is an unpleasant sensation which is sometimes difficult to ignore and at times may seem to worsen. The syndrome is quite common. It is estimated 45% of the population experiences features of the condition at some stage during their lives. The symptoms are usually temporary. The largest group of people who feel the need to seek medical treatment is females between the ages of 2830 to 7560.  CAUSES  Globus Syndrome appears to be triggered by  or aggravated by stress, anxiety and depression.  Tension related to stress could product abnormal muscle spasms in the esophagus which would account for the sensation of a lump or ball in your throat.  Frequent swallowing or drying of the throat caused by anxiety or other strong emotions can also produce this uncomfortable sensation in your throat.  Fear and sadness can be expressed by the body in many ways. For instance, if you had a relative with throat cancer you might become overly concerned about your own health and develop uncomfortable sensations in your throat.  The reaction to a crisis or a trauma event in your life can take the form of a lump in your throat. It is as if you are indirectly saying you can not handle or "swallow" one more thing. DIAGNOSIS  Usually your caregiver will know what is wrong by talking to you and examining you. If the condition persists for several days, more testing may be done to make sure there is not another problem present. This is usually not the case. TREATMENT   Reassurance is often the best treatment available. Usually the problem leaves without treatment over several days.  Sometimes anti-anxiety medications may be prescribed.  Counseling or talk therapy can also help with strong underlying emotions.  Note that in most cases this is not something that keeps coming back and you should not be concerned or worried. Document Released: 05/20/2003 Document Revised: 05/22/2011 Document Reviewed: 10/17/2007 [facility] Patient Information 2014 [place], [place].   Lymphadenopathy Lymphadenopathy means "disease of the lymph glands." But the term is usually used to describe swollen or  enlarged lymph glands, also called lymph nodes. These are the bean-shaped organs found in many locations including the neck, underarm, and groin. Lymph glands are part of the immune system, which fights infections in your body. Lymphadenopathy can occur in just one area of the  body, such as the neck, or it can be generalized, with lymph node enlargement in several areas. The nodes found in the neck are the most common sites of lymphadenopathy. CAUSES  When your immune system responds to germs (such as viruses or bacteria ), infection-fighting cells and fluid build up. This causes the glands to grow in size. This is usually not something to worry about. Sometimes, the glands themselves can become infected and inflamed. This is called lymphadenitis. Enlarged lymph nodes can be caused by many diseases:  Bacterial disease, such as strep throat or a skin infection.  Viral disease, such as a common cold.  Other germs, such as lyme disease, tuberculosis, or sexually transmitted diseases.  Cancers, such as lymphoma (cancer of the lymphatic system) or leukemia (cancer of the white blood cells).  Inflammatory diseases such as lupus or rheumatoid arthritis.  Reactions to medications. Many of the diseases above are rare, but important. This is why you should see your caregiver if you have lymphadenopathy. SYMPTOMS   Swollen, enlarged lumps in the neck, back of the head or other locations.  Tenderness.  Warmth or redness of the skin over the lymph nodes.  Fever. DIAGNOSIS  Enlarged lymph nodes are often near the source of infection. They can help healthcare providers diagnose your illness. For instance:   Swollen lymph nodes around the jaw might be caused by an infection in the mouth.  Enlarged glands in the neck often signal a throat infection.  Lymph nodes that are swollen in more than one area often indicate an illness caused by a virus. Your caregiver most likely will know what is causing your lymphadenopathy after listening to your history and examining you. Blood tests, x-rays or other tests may be needed. If the cause of the enlarged lymph node cannot be found, and it does not go away by itself, then a biopsy may be needed. Your caregiver will discuss this with  you. TREATMENT  Treatment for your enlarged lymph nodes will depend on the cause. Many times the nodes will shrink to normal size by themselves, with no treatment. Antibiotics or other medicines may be needed for infection. Only take over-the-counter or prescription medicines for pain, discomfort or fever as directed by your caregiver. HOME CARE INSTRUCTIONS  Swollen lymph glands usually return to normal when the underlying medical condition goes away. If they persist, contact your health-care provider. He/she might prescribe antibiotics or other treatments, depending on the diagnosis. Take any medications exactly as prescribed. Keep any follow-up appointments made to check on the condition of your enlarged nodes.  SEEK MEDICAL CARE IF:   Swelling lasts for more than two weeks.  You have symptoms such as weight loss, night sweats, fatigue or fever that does not go away.  The lymph nodes are hard, seem fixed to the skin or are growing rapidly.  Skin over the lymph nodes is red and inflamed. This could mean there is an infection. SEEK IMMEDIATE MEDICAL CARE IF:   Fluid starts leaking from the area of the enlarged lymph node.  You develop a fever of 102 F (38.9 C) or greater.  Severe pain develops (not necessarily at the site of a large lymph node).  You develop chest pain  or shortness of breath.  You develop worsening abdominal pain. MAKE SURE YOU:   Understand these instructions.  Will watch your condition.  Will get help right away if you are not doing well or get worse. Document Released: 12/07/2007 Document Revised: 05/22/2011 Document Reviewed: 12/07/2007 [facility] Patient Information 2014 [place], [place].  Swollen Lymph Nodes The lymphatic system filters fluid from around cells. It is like a system of blood vessels. These channels carry lymph instead of blood. The lymphatic system is an important part of the immune (disease fighting) system. When people talk about "swollen  glands in the neck," they are usually talking about swollen lymph nodes. The lymph nodes are like the little traps for infection. You and your caregiver may be able to feel lymph nodes, especially swollen nodes, in these common areas: the groin (inguinal area), armpits (axilla), and above the clavicle (supraclavicular). You may also feel them in the neck (cervical) and the back of the head just above the hairline (occipital). Swollen glands occur when there is any condition in which the body responds with an allergic type of reaction. For instance, the glands in the neck can become swollen from insect bites or any type of minor infection on the head. These are very noticeable in children with only minor problems. Lymph nodes may also become swollen when there is a tumor or problem with the lymphatic system, such as Hodgkin's disease. TREATMENT   Most swollen glands do not require treatment. They can be observed (watched) for a short period of time, if your caregiver feels it is necessary. Most of the time, observation is not necessary.  Antibiotics (medicines that kill germs) may be prescribed by your caregiver. Your caregiver may prescribe these if he or she feels the swollen glands are due to a bacterial (germ) infection. Antibiotics are not used if the swollen glands are caused by a virus. HOME CARE INSTRUCTIONS   Take medications as directed by your caregiver. Only take over-the-counter or prescription medicines for pain, discomfort, or fever as directed by your caregiver. SEEK MEDICAL CARE IF:   If you begin to run a temperature greater than 102 F (38.9 C), or as your caregiver suggests. MAKE SURE YOU:   Understand these instructions.  Will watch your condition.  Will get help right away if you are not doing well or get worse. Document Released: 02/17/2002 Document Revised: 05/22/2011 Document Reviewed: 02/27/2005 [facility] Patient Information 2014 [place], [place].

## 2013-07-03 NOTE — ED Notes (Signed)
Pt states he ate chinese food about two hours ago then developed a knot on the left side of his throat, he states he feels like he can't breathe and his heart rate is elevated at 126

## 2013-07-04 LAB — CULTURE, GROUP A STREP

## 2013-08-29 ENCOUNTER — Emergency Department (HOSPITAL_COMMUNITY): Payer: Self-pay

## 2013-08-29 ENCOUNTER — Emergency Department (HOSPITAL_COMMUNITY)
Admission: EM | Admit: 2013-08-29 | Discharge: 2013-08-29 | Disposition: A | Discharge status: Home / Self Care | Payer: Self-pay | Attending: Emergency Medicine | Admitting: Emergency Medicine

## 2013-08-29 ENCOUNTER — Encounter (HOSPITAL_COMMUNITY): Payer: Self-pay | Admitting: Emergency Medicine

## 2013-08-29 DIAGNOSIS — F17200 Nicotine dependence, unspecified, uncomplicated: Secondary | ICD-10-CM | POA: Insufficient documentation

## 2013-08-29 DIAGNOSIS — IMO0002 Reserved for concepts with insufficient information to code with codable children: Secondary | ICD-10-CM | POA: Insufficient documentation

## 2013-08-29 DIAGNOSIS — W1789XA Other fall from one level to another, initial encounter: Secondary | ICD-10-CM | POA: Insufficient documentation

## 2013-08-29 DIAGNOSIS — Y9289 Other specified places as the place of occurrence of the external cause: Secondary | ICD-10-CM | POA: Insufficient documentation

## 2013-08-29 DIAGNOSIS — W132XXA Fall from, out of or through roof, initial encounter: Secondary | ICD-10-CM

## 2013-08-29 DIAGNOSIS — S76912A Strain of unspecified muscles, fascia and tendons at thigh level, left thigh, initial encounter: Secondary | ICD-10-CM

## 2013-08-29 DIAGNOSIS — Z791 Long term (current) use of non-steroidal anti-inflammatories (NSAID): Secondary | ICD-10-CM | POA: Insufficient documentation

## 2013-08-29 DIAGNOSIS — Y9389 Activity, other specified: Secondary | ICD-10-CM | POA: Insufficient documentation

## 2013-08-29 MED ORDER — METHOCARBAMOL 500 MG PO TABS: 500.0000 mg | ORAL_TABLET | Freq: Two times a day (BID) | ORAL | Status: DC

## 2013-08-29 MED ORDER — NAPROXEN 500 MG PO TABS: 500.0000 mg | ORAL_TABLET | Freq: Two times a day (BID) | ORAL | Status: DC

## 2013-08-29 MED ORDER — TRAMADOL HCL 50 MG PO TABS: 50.0000 mg | ORAL_TABLET | Freq: Four times a day (QID) | ORAL | Status: DC | PRN

## 2013-08-29 MED ORDER — TRAMADOL HCL 50 MG PO TABS
50.0000 mg | ORAL_TABLET | Freq: Once | ORAL | Status: AC
  Administered 2013-08-29: 50 mg via ORAL
  Filled 2013-08-29: qty 1

## 2013-08-29 NOTE — ED Provider Notes (Signed)
Medical screening examination/treatment/procedure(s) were performed by non-physician practitioner and as supervising physician I was immediately available for consultation/collaboration.   EKG Interpretation None        Layla MawKristen N Torrey Ballinas, DO 08/29/13 1220

## 2013-08-29 NOTE — ED Notes (Signed)
Pt c/o pain in low back and left upper leg. Pt slipped and fell from roof 2 days ago, approx. 12 feet from ground. Denies LOC. Back pain increased today, pain unresponsive to motrin. Denies striking head

## 2013-08-29 NOTE — Discharge Instructions (Signed)
RICE: Routine Care for Injuries The routine care of many injuries includes Rest, Ice, Compression, and Elevation (RICE). HOME CARE INSTRUCTIONS  Rest is needed to allow your body to heal. Routine activities can usually be resumed when comfortable. Injured tendons and bones can take up to 6 weeks to heal. Tendons are the cord-like structures that attach muscle to bone.  Ice following an injury helps keep the swelling down and reduces pain.  Put ice in a plastic bag.  Place a towel between your skin and the bag.  Leave the ice on for 15-20 minutes, 3-4 times a day, or as directed by your health care provider. Do this while awake, for the first 24 to 48 hours. After that, continue as directed by your caregiver.  Compression helps keep swelling down. It also gives support and helps with discomfort. If an elastic bandage has been applied, it should be removed and reapplied every 3 to 4 hours. It should not be applied tightly, but firmly enough to keep swelling down. Watch fingers or toes for swelling, bluish discoloration, coldness, numbness, or excessive pain. If any of these problems occur, remove the bandage and reapply loosely. Contact your caregiver if these problems continue.  Elevation helps reduce swelling and decreases pain. With extremities, such as the arms, hands, legs, and feet, the injured area should be placed near or above the level of the heart, if possible. SEEK IMMEDIATE MEDICAL CARE IF:  You have persistent pain and swelling.  You develop redness, numbness, or unexpected weakness.  Your symptoms are getting worse rather than improving after several days. These symptoms may indicate that further evaluation or further X-rays are needed. Sometimes, X-rays may not show a small broken bone (fracture) until 1 week or 10 days later. Make a follow-up appointment with your caregiver. Ask when your X-ray results will be ready. Make sure you get your X-ray results. Document Released:  06/11/2000 Document Revised: 03/04/2013 Document Reviewed: 07/29/2010 [facility] Patient Information 2015 [place], [place]. This information is not intended to replace advice given to you by your health care provider. Make sure you discuss any questions you have with your health care provider.    Hamstring Strain  Hamstrings are the large muscles in the back of the thighs. A strain or tear injury happens when there is a sudden stretch or pull on these muscles and tendons. Tendons are cord like structures that attach muscle to bone. These injuries are commonly seen in activities such as sprinting due to sudden acceleration.  DIAGNOSIS  Often the diagnosis can be made by examination. HOME CARE INSTRUCTIONS   Apply ice to the sore area for 15-6820minutes, 03-04 times per day. Do this while awake for the first 2 days. Put the ice in a plastic bag, and place a towel between the bag of ice and your skin.  Keep your knee flexed when possible. This means your foot is held off the ground slightly if you are on crutches. When lying down, a pillow under the knee will take strain off the muscles and provide some relief.  If a compression bandage such as an ace wrap was applied, use it until you are seen again. You may remove it for sleeping, showers and baths. If the wrap seems to be too tight and is uncomfortable, wrap it more loosely. If your toes or foot are getting cold or blue, it is too tight.  Walk or move around as the pain allows, or as instructed. Resume full activities as suggested by  your caregiver. This is often safest when the strength of the injured leg has nearly returned to normal.  Only take over-the-counter or prescription medicines for pain, discomfort, or fever as directed by your caregiver. SEEK MEDICAL CARE IF:   You have an increase in bruising, swelling or pain.  You notice coldness or blueness of your toes or foot.  Pain relief is not obtained with medications.  You have  increasing pain in the area and seem to be getting worse rather than better.  You notice your thigh getting larger in size (this could indicate bleeding into the muscle). Document Released: 11/22/2000 Document Revised: 05/22/2011 Document Reviewed: 03/01/2008 [facility]Exit[facility] Patient Information 2015 [place], [place]. This information is not intended to replace advice given to you by your health care provider. Make sure you discuss any questions you have with your health care provider.

## 2013-08-29 NOTE — ED Notes (Signed)
Returned from radiology. 

## 2013-08-29 NOTE — ED Provider Notes (Signed)
CSN: [id number]     Arrival date & time 08/29/13  1043 History   First MD Initiated Contact with Patient 08/29/13 1048     Chief Complaint  Patient presents with  . Back Pain  . Leg Pain    l/leg pain 2 days post fall  . Fall    fell 2 days ago-denies LOC     (Consider location/radiation/quality/duration/timing/severity/associated sxs/prior Treatment) HPI  28 year old male with no significant past medical history presents for evaluations of left leg pain status post a fall 2 days ago.  Pt report he fell from roof approx 12 feet from the ground.  Did not strike head and no LOC.  Pt land on his feet onto muddy brown, fell backward. Experiencing acute onset of pain to his low back radiates to his left thigh. Pain is 7/10, he is able to ambulate afterward and continued to work. He work yesterday as well, but states pain has become more bothersome worsening with movement. Expressing mild tingling sensation to left thigh. Taken 800 mg ibuprofen yesterday provide minimal relief. He denies any neck pain, headache, chest pain, upper back pain, abdominal pain, knee pain, ankle pain, or foot pain.  Marland Kitchen.   No past medical history on file. Past Surgical History  Procedure Laterality Date  . Shoulder surgery     No family history on file. History  Substance Use Topics  . Smoking status: Current Every Day Smoker -- 0.50 packs/day  . Smokeless tobacco: Not on file  . Alcohol Use: No    Review of Systems  Musculoskeletal: Positive for arthralgias.  Skin: Negative for rash and wound.  Neurological: Negative for numbness.      Allergies  Review of patient's allergies indicates no known allergies.  Home Medications   Prior to Admission medications   Medication Sig Start Date End Date Taking? Authorizing Provider  methylPREDNISolone (MEDROL DOSEPAK) 4 MG tablet Take as directed on pack 07/03/13   Olivia Mackielga M Otter, MD  naproxen (NAPROSYN) 500 MG tablet Take 1 tablet (500 mg total) by mouth 2 (two)  times daily. 07/03/13   Olivia Mackielga M Otter, MD  phenol-menthol (CEPASTAT) 14.5 MG lozenge Place 1 lozenge inside cheek as needed for sore throat. 07/03/13   Olivia Mackielga M Otter, MD   There were no vitals taken for this visit. Physical Exam  Constitutional: He appears well-developed and well-nourished. No distress.  HENT:  Head: Atraumatic.  Eyes: Conjunctivae are normal.  Neck: Normal range of motion. Neck supple.  Cardiovascular: Intact distal pulses.   Abdominal: There is no tenderness.  Musculoskeletal: He exhibits tenderness (Tenderness to mid thigh lumbar spine and paralumbar on palpation without crepitus or step-off).  Tenderness along left lateral hip, and left thigh edema without any obvious deformity. Normal passive range of motion, pain with active range of motion but maintain full range of motion. No bruising noted.  Left knee with normal flexion extension and no tenderness. Left ankle with full range of motion. Left foot and right foot without any focal point tenderness.  Neurological: He is alert.  Skin: No rash noted.  Psychiatric: He has a normal mood and affect.    ED Course  Procedures (including critical care time)  Patient reports falling from a roof. He is not complaining of low back pain left thigh pain. He is able to ambulate without significant discomfort. I suspect muscle strain causing his pain and less likely to be acute fracture. However given the height of his fall, I will obtain L. spine  and left femur x-ray. Ultram given for pain.  12:01 PM xrays neg for acute fx/dislocation. Reassurance given.  RICE Therapy discussed.  NSAIDs, Ultram and muscle relaxant prescribed.  Ortho referral given.  Pt able to ambulate.    Labs Review Labs Reviewed - No data to display  Imaging Review Dg Lumbar Spine Complete  08/29/2013   CLINICAL DATA:  Fall from roof.  Low back pain  EXAM: LUMBAR SPINE - COMPLETE 4+ VIEW  COMPARISON:  None.  FINDINGS: There is no evidence of lumbar spine  fracture. Alignment is normal. Intervertebral disc spaces are maintained.  IMPRESSION: Negative.   Electronically Signed   By: Charlett NoseKevin  Dover M.D.   On: 08/29/2013 11:33   Dg Femur Left  08/29/2013   CLINICAL DATA:  Fall from roof.  Left femur pain.  EXAM: LEFT FEMUR - 2 VIEW  COMPARISON:  None.  FINDINGS: There is no evidence of fracture or other focal bone lesions. Soft tissues are unremarkable.  IMPRESSION: Negative.   Electronically Signed   By: Charlett NoseKevin  Dover M.D.   On: 08/29/2013 11:33     EKG Interpretation None      MDM   Final diagnoses:  Fall from roof, initial encounter  Muscle strain of left thigh, initial encounter    BP 112/71  Pulse 92  Temp(Src) 98.2 F (36.8 C) (Oral)  Resp 18  Wt 177 lb (80.287 kg)  SpO2 99%  I have reviewed nursing notes and vital signs. I personally reviewed the imaging tests through PACS system  I reviewed available ER/hospitalization records thought the EMR     Fayrene HelperBowie Tran, [place] JerseyPA-C 08/29/13 1201

## 2014-05-15 IMAGING — CT CT NECK W/ CM
1 of 3 series · 7 of 14 positions shown, 9 images · IV contrast (100 ML OMNI 300)
Comparison: CT HEAD W/O CM dated 09/23/2010

CLINICAL DATA: Swelling to the left side of the neck after eating
try knees food at midnight. Difficulty swallowing. Twitching
movement.

EXAM:
CT NECK WITH CONTRAST
TECHNIQUE: Multidetector CT imaging of the neck was performed using the
standard protocol following the bolus administration of intravenous
contrast.
CONTRAST:  100mL OMNIPAQUE IOHEXOL 300 MG/ML  SOLN

[Series 11: neck with st · axial · 0.39mm/px · z∈[-320,-134]mm · 7 of 125 slices shown, 9 images]
[im 16/125  soft-tissue]
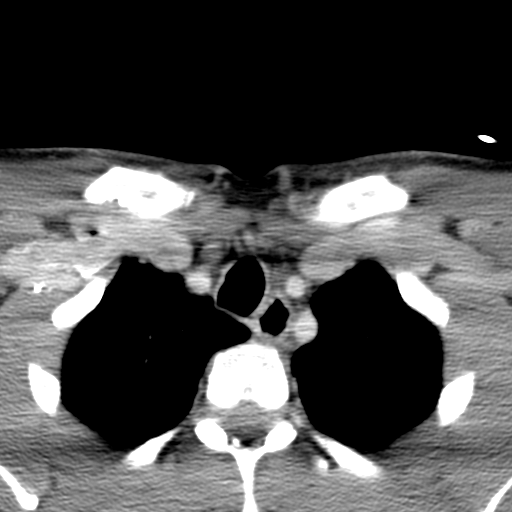
[im 16/125  bone]
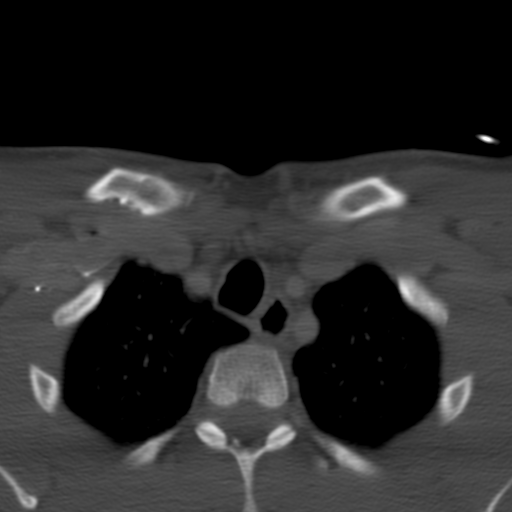
[im 32/125  bone]
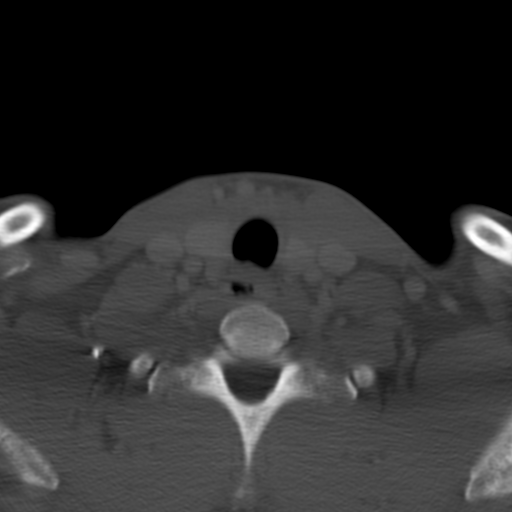
[im 47/125  bone]
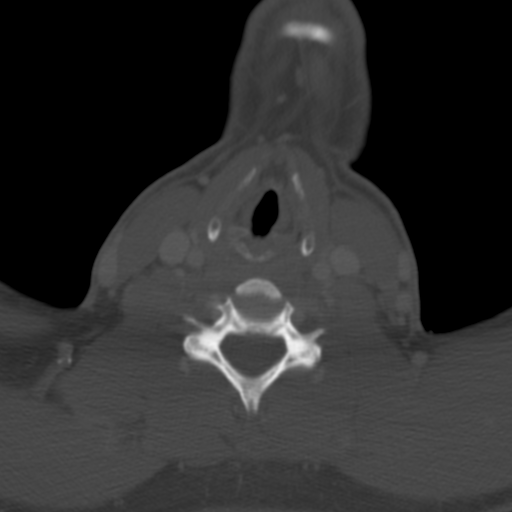
[im 63/125  bone]
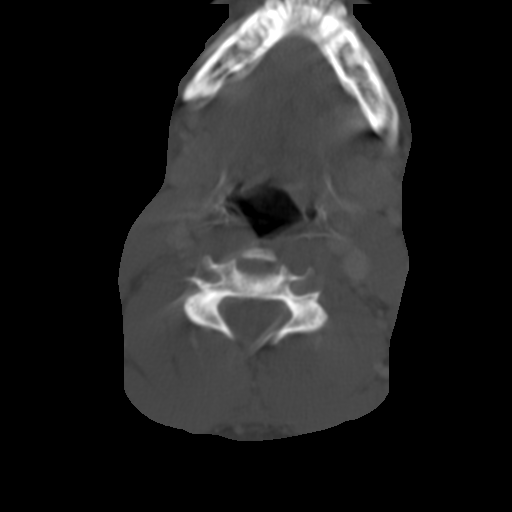
[im 78/125  soft-tissue]
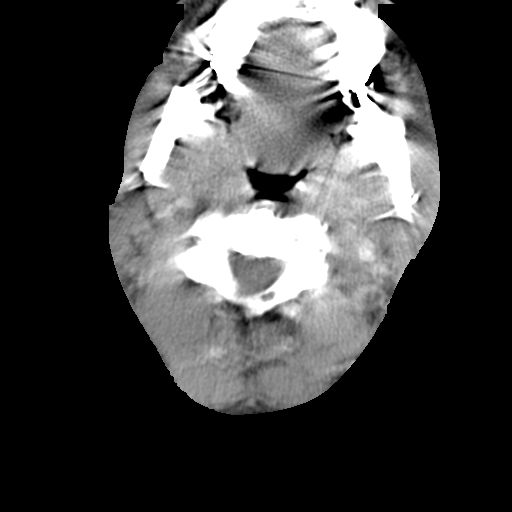
[im 78/125  bone]
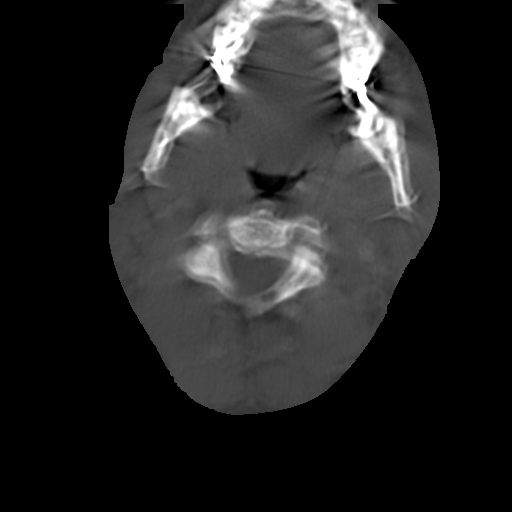
[im 94/125  bone]
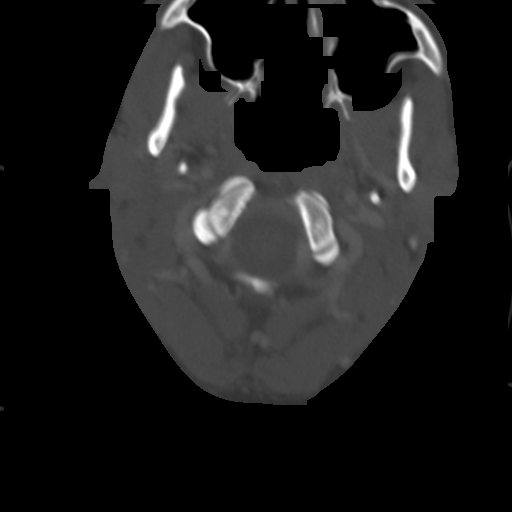
[im 109/125  bone]
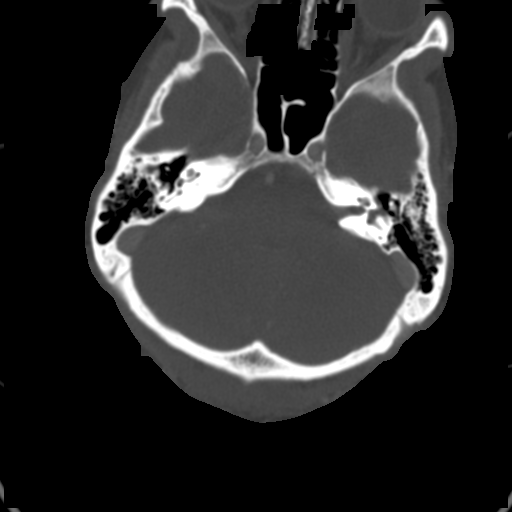

[7 of 14 positions shown; findings below may reference images not displayed]

FINDINGS: Examination is technically limited due to severe motion artifact and
streak artifact from dental hardware, rendering significant portions
of the examination uninterpretable. Visualized segments demonstrate
no gross mass or fluid collection in the neck. Visualized cervical
carotid and jugular vessels appear patent. Visualized salivary
glands appear symmetrical. Nasal oral pharyngeal airway appears
patent. Visualized lymph nodes are not pathologically enlarged.
IMPRESSION: Examination is significantly technically limited. As visualize, no
gross evidence of mass or fluid collection.

## 2022-08-14 ENCOUNTER — Emergency Department (HOSPITAL_COMMUNITY)
Admission: EM | Admit: 2022-08-14 | Discharge: 2022-08-14 | Disposition: A | Discharge status: Home / Self Care | Payer: Commercial Managed Care - HMO | Attending: Emergency Medicine | Admitting: Emergency Medicine

## 2022-08-14 ENCOUNTER — Other Ambulatory Visit: Payer: Self-pay

## 2022-08-14 ENCOUNTER — Encounter (HOSPITAL_COMMUNITY): Payer: Self-pay

## 2022-08-14 DIAGNOSIS — R112 Nausea with vomiting, unspecified: Secondary | ICD-10-CM | POA: Diagnosis present

## 2022-08-14 DIAGNOSIS — R197 Diarrhea, unspecified: Secondary | ICD-10-CM | POA: Diagnosis not present

## 2022-08-14 DIAGNOSIS — M545 Low back pain, unspecified: Secondary | ICD-10-CM

## 2022-08-14 LAB — CBC WITH DIFFERENTIAL/PLATELET
Abs Immature Granulocytes: 0.03 10*3/uL (ref 0.00–0.07)
Basophils Absolute: 0 10*3/uL (ref 0.0–0.1)
Basophils Relative: 0 %
Eosinophils Absolute: 0.3 10*3/uL (ref 0.0–0.5)
Eosinophils Relative: 3 %
HCT: 45.8 % (ref 39.0–52.0)
Hemoglobin: 16.4 g/dL (ref 13.0–17.0)
Immature Granulocytes: 0 %
Lymphocytes Relative: 22 %
Lymphs Abs: 1.9 10*3/uL (ref 0.7–4.0)
MCH: 32.9 pg (ref 26.0–34.0)
MCHC: 35.8 g/dL (ref 30.0–36.0)
MCV: 91.8 fL (ref 80.0–100.0)
Monocytes Absolute: 0.8 10*3/uL (ref 0.1–1.0)
Monocytes Relative: 9 %
Neutro Abs: 5.8 10*3/uL (ref 1.7–7.7)
Neutrophils Relative %: 66 %
Platelets: 239 10*3/uL (ref 150–400)
RBC: 4.99 MIL/uL (ref 4.22–5.81)
RDW: 11.9 % (ref 11.5–15.5)
WBC: 8.9 10*3/uL (ref 4.0–10.5)
nRBC: 0 % (ref 0.0–0.2)

## 2022-08-14 LAB — COMPREHENSIVE METABOLIC PANEL
ALT: 14 U/L (ref 0–44)
AST: 15 U/L (ref 15–41)
Albumin: 4 g/dL (ref 3.5–5.0)
Alkaline Phosphatase: 77 U/L (ref 38–126)
Anion gap: 10 (ref 5–15)
BUN: 16 mg/dL (ref 6–20)
CO2: 19 mmol/L — ABNORMAL LOW (ref 22–32)
Calcium: 8.5 mg/dL — ABNORMAL LOW (ref 8.9–10.3)
Chloride: 110 mmol/L (ref 98–111)
Creatinine, Ser: 0.84 mg/dL (ref 0.61–1.24)
GFR, Estimated: >60 mL/min (ref >60)
Glucose, Bld: 130 mg/dL — ABNORMAL HIGH (ref 70–99)
Potassium: 3.7 mmol/L (ref 3.5–5.1)
Sodium: 139 mmol/L (ref 135–145)
Total Bilirubin: 0.5 mg/dL (ref 0.3–1.2)
Total Protein: 6.9 g/dL (ref 6.5–8.1)

## 2022-08-14 LAB — LIPASE, BLOOD: Lipase: 38 U/L (ref 11–51)

## 2022-08-14 MED ORDER — ONDANSETRON 4 MG PO TBDP: 4.0000 mg | ORAL_TABLET | Freq: Three times a day (TID) | ORAL | 0 refills | Status: AC | PRN

## 2022-08-14 MED ORDER — ONDANSETRON HCL 4 MG/2ML IJ SOLN
4.0000 mg | Freq: Once | INTRAMUSCULAR | Status: AC
  Administered 2022-08-14: 4 mg via INTRAVENOUS
  Filled 2022-08-14: qty 2

## 2022-08-14 MED ORDER — BACLOFEN 10 MG PO TABS: 10.0000 mg | ORAL_TABLET | Freq: Three times a day (TID) | ORAL | 0 refills | Status: AC

## 2022-08-14 MED ORDER — LACTATED RINGERS IV BOLUS
1000.0000 mL | Freq: Once | INTRAVENOUS | Status: AC
Start: 2022-08-14 — End: 2022-08-14
  Administered 2022-08-14: 1000 mL via INTRAVENOUS

## 2022-08-14 MED ORDER — KETOROLAC TROMETHAMINE 30 MG/ML IJ SOLN
30.0000 mg | Freq: Once | INTRAMUSCULAR | Status: AC
  Administered 2022-08-14: 30 mg via INTRAVENOUS
  Filled 2022-08-14: qty 1

## 2022-08-14 NOTE — ED Notes (Signed)
Pt tolerated sprite, peanut butter and graham crackers w/o complaint.

## 2022-08-14 NOTE — ED Provider Notes (Signed)
[place] EMERGENCY DEPARTMENT AT [facility] Provider Note   CSN: [id number] Arrival date & time: 08/14/22  1020     History  Chief Complaint  Patient presents with   Emesis   Generalized Body Aches    Henry Calhoun is a 37 y.o. male without significant past medical history presents to the ED complaining of generalized body aches, nausea with vomiting, diarrhea, and lower back pain.  Patient states it began early this morning around 0300 and he vomited 7 times.  He was able to drink water while at work and keep it down, but has not had anything else.  He reports his back pain began at work without known injury, but he has had similar back pain in the past.  Patient has not tried any medications prior to arrival.  Denies fever, chills, abdominal pain, syncope, weakness, numbness, loss of bladder or bowel control, headache, dizziness, light-headedness, urinary symptoms, drug or alcohol use.       Home Medications Prior to Admission medications   Medication Sig Start Date End Date Taking? Authorizing Provider  baclofen (LIORESAL) 10 MG tablet Take 1 tablet (10 mg total) by mouth 3 (three) times daily. 08/14/22  Yes Rodric Punch R, PA-C  ondansetron (ZOFRAN-ODT) 4 MG disintegrating tablet Take 1 tablet (4 mg total) by mouth every 8 (eight) hours as needed for nausea or vomiting. 08/14/22  Yes Kristine Chahal R, PA-C      Allergies    Patient has no known allergies.    Review of Systems   Review of Systems  Constitutional:  Negative for chills and fever.  Gastrointestinal:  Positive for diarrhea, nausea and vomiting. Negative for abdominal pain.  Musculoskeletal:  Positive for back pain and myalgias.  Neurological:  Negative for dizziness, syncope, weakness, light-headedness, numbness and headaches.    Physical Exam Updated Vital Signs BP 133/84 (BP Location: Right Arm)   Pulse 78   Temp 97.6 F (36.4 C) (Oral)   Resp 17   Ht 5\' 10"  (1.778 m)   Wt 95.3 kg   SpO2 97%    BMI 30.13 kg/m  Physical Exam Vitals and nursing note reviewed.  Constitutional:      General: He is not in acute distress.    Appearance: Normal appearance. He is not ill-appearing or diaphoretic.  Cardiovascular:     Rate and Rhythm: Normal rate and regular rhythm.  Pulmonary:     Effort: Pulmonary effort is normal.     Breath sounds: Normal breath sounds and air entry.  Abdominal:     General: Abdomen is flat. Bowel sounds are normal.     Palpations: Abdomen is soft.     Tenderness: There is no abdominal tenderness.  Musculoskeletal:     Lumbar back: Spasms and tenderness present. No deformity, signs of trauma or bony tenderness. Decreased range of motion (mild).  Skin:    General: Skin is warm and dry.     Capillary Refill: Capillary refill takes less than 2 seconds.  Neurological:     Mental Status: He is alert. Mental status is at baseline.     Gait: Gait is intact.  Psychiatric:        Mood and Affect: Mood normal.        Behavior: Behavior normal.     ED Results / Procedures / Treatments   Labs (all labs ordered are listed, but only abnormal results are displayed) Labs Reviewed  COMPREHENSIVE METABOLIC PANEL - Abnormal; Notable for the following components:  Result Value   CO2 19 (*)    Glucose, Bld 130 (*)    Calcium 8.5 (*)    All other components within normal limits  LIPASE, BLOOD  CBC WITH DIFFERENTIAL/PLATELET    EKG None  Radiology No results found.  Procedures Procedures    Medications Ordered in ED Medications  ondansetron (ZOFRAN) injection 4 mg (4 mg Intravenous Given 08/14/22 1109)  lactated ringers bolus 1,000 mL (0 mLs Intravenous Stopped 08/14/22 1207)  ketorolac (TORADOL) 30 MG/ML injection 30 mg (30 mg Intravenous Given 08/14/22 1206)    ED Course/ Medical Decision Making/ A&P                             Medical Decision Making Amount and/or Complexity of Data Reviewed Labs: ordered.  Risk Prescription drug  management.   This patient presents to the ED with chief complaint(s) of body aches, back pain, N/V/D with non-contributory past medical history.  The complaint involves an extensive differential diagnosis and also carries with it a high risk of complications and morbidity.    The differential diagnosis includes pancreatitis, acute gastritis, acute gastroenteritis, influenza, COVID, other viral etiology   The initial plan is to obtain baseline labs, give IV fluids   Initial Assessment:   On exam, patient is overall well appearing and not in acute distress.  Abdomen is soft and non tender to palpation.  Skin is warm and dry.  Lumbar spine with bilateral paraspinal tenderness and appreciable spasms.  Gait intact.  Reduced flexion and extension of the spine due to pain.    Independent ECG/labs interpretation:  The following labs were independently interpreted:  CBC without leukocytosis or anemia.  Metabolic panel with mild hypocalcemia, but no major electrolyte disturbances.  Lipase normal.    Treatment and Reassessment: Patient given IV fluids, Zofran, and IV Toradol.  He reports his symptoms have improved.  Patient has not had any episodes of vomiting while in the ED.  Patient passed PO challenge.   Disposition:   Will send patient home with baclofen for back spasms/pain and ODT Zofran for nausea/vomiting.  Patient has been stable and able to tolerate PO.  The patient has been appropriately medically screened and/or stabilized in the ED. I have low suspicion for any other emergent medical condition which would require further screening, evaluation or treatment in the ED or require inpatient management. At time of discharge the patient is hemodynamically stable and in no acute distress. I have discussed work-up results and diagnosis with patient and answered all questions. Patient is agreeable with discharge plan. We discussed strict return precautions for returning to the emergency department and  they verbalized understanding.            Final Clinical Impression(s) / ED Diagnoses Final diagnoses:  Nausea vomiting and diarrhea  Acute bilateral low back pain without sciatica    Rx / DC Orders ED Discharge Orders          Ordered    baclofen (LIORESAL) 10 MG tablet  3 times daily        08/14/22 1242    ondansetron (ZOFRAN-ODT) 4 MG disintegrating tablet  Every 8 hours PRN        08/14/22 1242              Lenard Simmer, PA-C 08/14/22 1243    Mardene Sayer, MD 08/14/22 1443

## 2022-08-14 NOTE — Discharge Instructions (Addendum)
Thank you for allowing me to be a part of your care today.   Your lab work up today is overall reassuring.  Your calcium was mildly low.  I encourage you to increase your daily calcium intake or supplement with vitamin.    I have sent over a muscle relaxant to help with your back pain and a nausea medicine.  Be sure to drink plenty of fluids and stay well hydrated.  I also recommend taking 800 mg of ibuprofen every 6-8 hours as needed for back pain.    Return to the ED if you develop sudden worsening of your symptoms or if you have any new concerns.  Follow up with your primary care doctor as needed.

## 2022-08-14 NOTE — ED Triage Notes (Signed)
Patient is here for evaluation of vomiting and generalized body aches. Reports that the vomiting started last night and this morning he woke up with body aches. States he was able to drink one bottle of water this morning, but that is it. Pt also reports lower back pain.
